# Patient Record
Sex: Male | Born: 1990 | Race: Black or African American | Hispanic: No | Marital: Single | Smoking: Current every day smoker
Health system: Southern US, Community
[De-identification: ages and names within clinical notes are randomized; demographics above are authoritative.]

## PROBLEM LIST (undated history)

## (undated) HISTORY — PX: DG HIP RIGHT COMPLETE (ARMC HX): HXRAD1537

---

## 2018-12-30 ENCOUNTER — Emergency Department (HOSPITAL_BASED_OUTPATIENT_CLINIC_OR_DEPARTMENT_OTHER): Payer: Self-pay

## 2018-12-30 ENCOUNTER — Encounter (HOSPITAL_BASED_OUTPATIENT_CLINIC_OR_DEPARTMENT_OTHER): Payer: Self-pay

## 2018-12-30 ENCOUNTER — Other Ambulatory Visit: Payer: Self-pay

## 2018-12-30 ENCOUNTER — Emergency Department (HOSPITAL_BASED_OUTPATIENT_CLINIC_OR_DEPARTMENT_OTHER)
Admission: EM | Admit: 2018-12-30 | Discharge: 2018-12-30 | Disposition: A | Discharge status: Home / Self Care | Payer: Self-pay | Attending: Emergency Medicine | Admitting: Emergency Medicine

## 2018-12-30 DIAGNOSIS — M25551 Pain in right hip: Secondary | ICD-10-CM | POA: Insufficient documentation

## 2018-12-30 DIAGNOSIS — F17210 Nicotine dependence, cigarettes, uncomplicated: Secondary | ICD-10-CM | POA: Insufficient documentation
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
## 2018-12-30 NOTE — ED Notes (Signed)
Pt. Reports he went to work and needs a note due to he can;t do the job he normally does.
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
## 2019-10-04 ENCOUNTER — Emergency Department (HOSPITAL_BASED_OUTPATIENT_CLINIC_OR_DEPARTMENT_OTHER)
Admission: EM | Admit: 2019-10-04 | Discharge: 2019-10-04 | Disposition: A | Discharge status: Home / Self Care | Payer: Self-pay | Attending: Emergency Medicine | Admitting: Emergency Medicine

## 2019-10-04 ENCOUNTER — Emergency Department (HOSPITAL_BASED_OUTPATIENT_CLINIC_OR_DEPARTMENT_OTHER): Payer: Self-pay

## 2019-10-04 ENCOUNTER — Other Ambulatory Visit: Payer: Self-pay

## 2019-10-04 ENCOUNTER — Encounter (HOSPITAL_BASED_OUTPATIENT_CLINIC_OR_DEPARTMENT_OTHER): Payer: Self-pay | Admitting: Emergency Medicine

## 2019-10-04 DIAGNOSIS — M25551 Pain in right hip: Secondary | ICD-10-CM | POA: Insufficient documentation

## 2019-10-04 DIAGNOSIS — F17200 Nicotine dependence, unspecified, uncomplicated: Secondary | ICD-10-CM | POA: Insufficient documentation

## 2019-10-04 MED ORDER — LIDOCAINE 5 % EX PTCH: 1.0000 | MEDICATED_PATCH | CUTANEOUS | 0 refills | Status: AC

## 2019-10-04 MED ORDER — METHOCARBAMOL 500 MG PO TABS: 500.0000 mg | ORAL_TABLET | Freq: Two times a day (BID) | ORAL | 0 refills | Status: AC

## 2019-10-04 NOTE — ED Triage Notes (Signed)
Pt states that he has had surgery to his right hip in 2015 - since mid January he has had pain to his hip that is increasing. His job will not given him light duty or " rest" until he has it evaluated
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
## 2019-10-04 NOTE — Discharge Instructions (Addendum)
  Antiinflammatory medications: Take 600 mg of ibuprofen every 6 hours or 440 mg (over the counter dose) to 500 mg (prescription dose) of naproxen every 12 hours for the next 3 days. After this time, these medications may be used as needed for pain. Take these medications with food to avoid upset stomach. Choose only one of these medications, do not take them together. Acetaminophen (generic for Tylenol): Should you continue to have additional pain while taking the ibuprofen or naproxen, you may add in acetaminophen as needed. Your daily total maximum amount of acetaminophen from all sources should be limited to 4000mg/day for persons without liver problems, or 2000mg/day for those with liver problems. Methocarbamol: Methocarbamol (generic for Robaxin) is a muscle relaxer and can help relieve stiff muscles or muscle spasms.  Do not drive or perform other dangerous activities while taking this medication as it can cause drowsiness as well as changes in reaction time and judgement. Lidocaine patches: These are available via either prescription or over-the-counter. The over-the-counter option may be more economical one and are likely just as effective. There are multiple over-the-counter brands, such as Salonpas. Ice: May apply ice to the area over the next 24 hours for 15 minutes at a time to reduce pain, inflammation, and swelling, if present. Exercises: Be sure to perform the attached exercises starting with three times a week and working up to performing them daily. This is an essential part of preventing long term problems.  Follow up: Follow up with a primary care provider for any future management of these complaints. Be sure to follow up within 7-10 days. Return: Return to the ED should symptoms worsen.  For prescription assistance, may try using prescription discount sites or apps, such as goodrx.com 

## 2021-09-24 IMAGING — CR DG HIP (WITH OR WITHOUT PELVIS) 2-3V*R*
3 series · 3 of 3 positions shown · non-contrast
Comparison: 12/30/2018

CLINICAL DATA: Hip pain. Previous acetabular repair 6654.

EXAM:
DG HIP (WITH OR WITHOUT PELVIS) 2-3V RIGHT

[t pelvis a.p.]
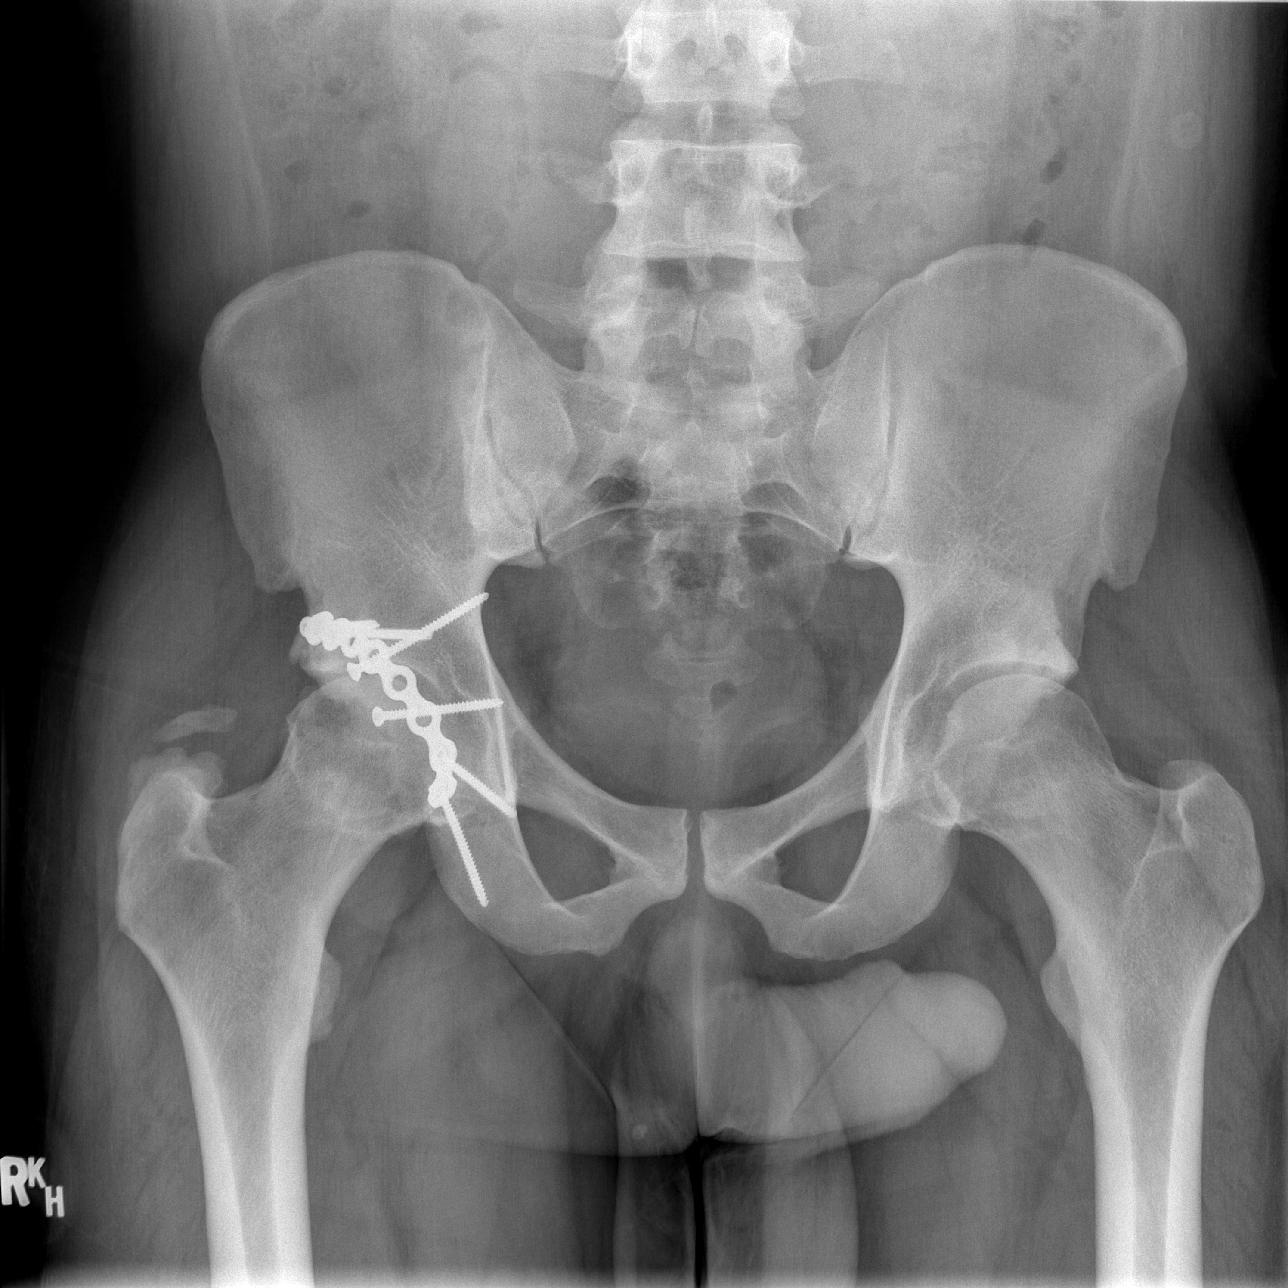

[t hip ap right]
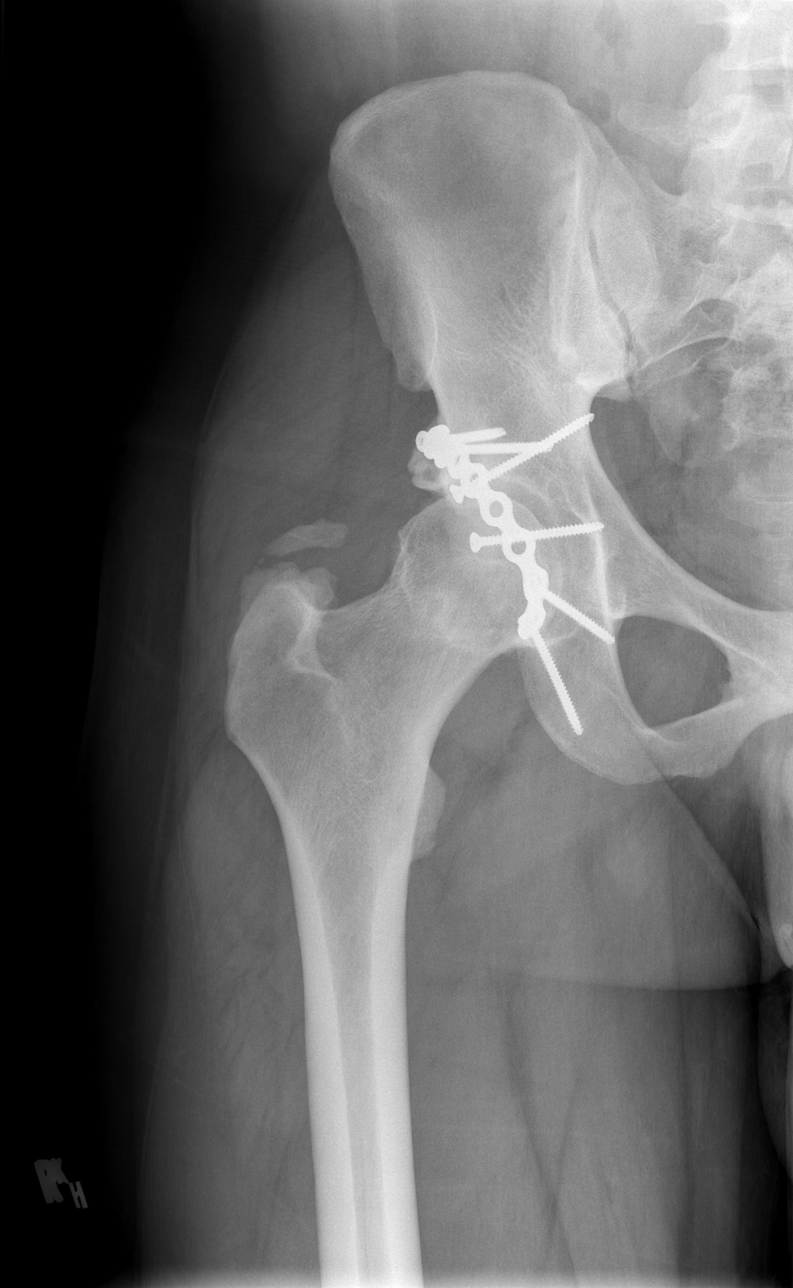

[t hip frog leg right]
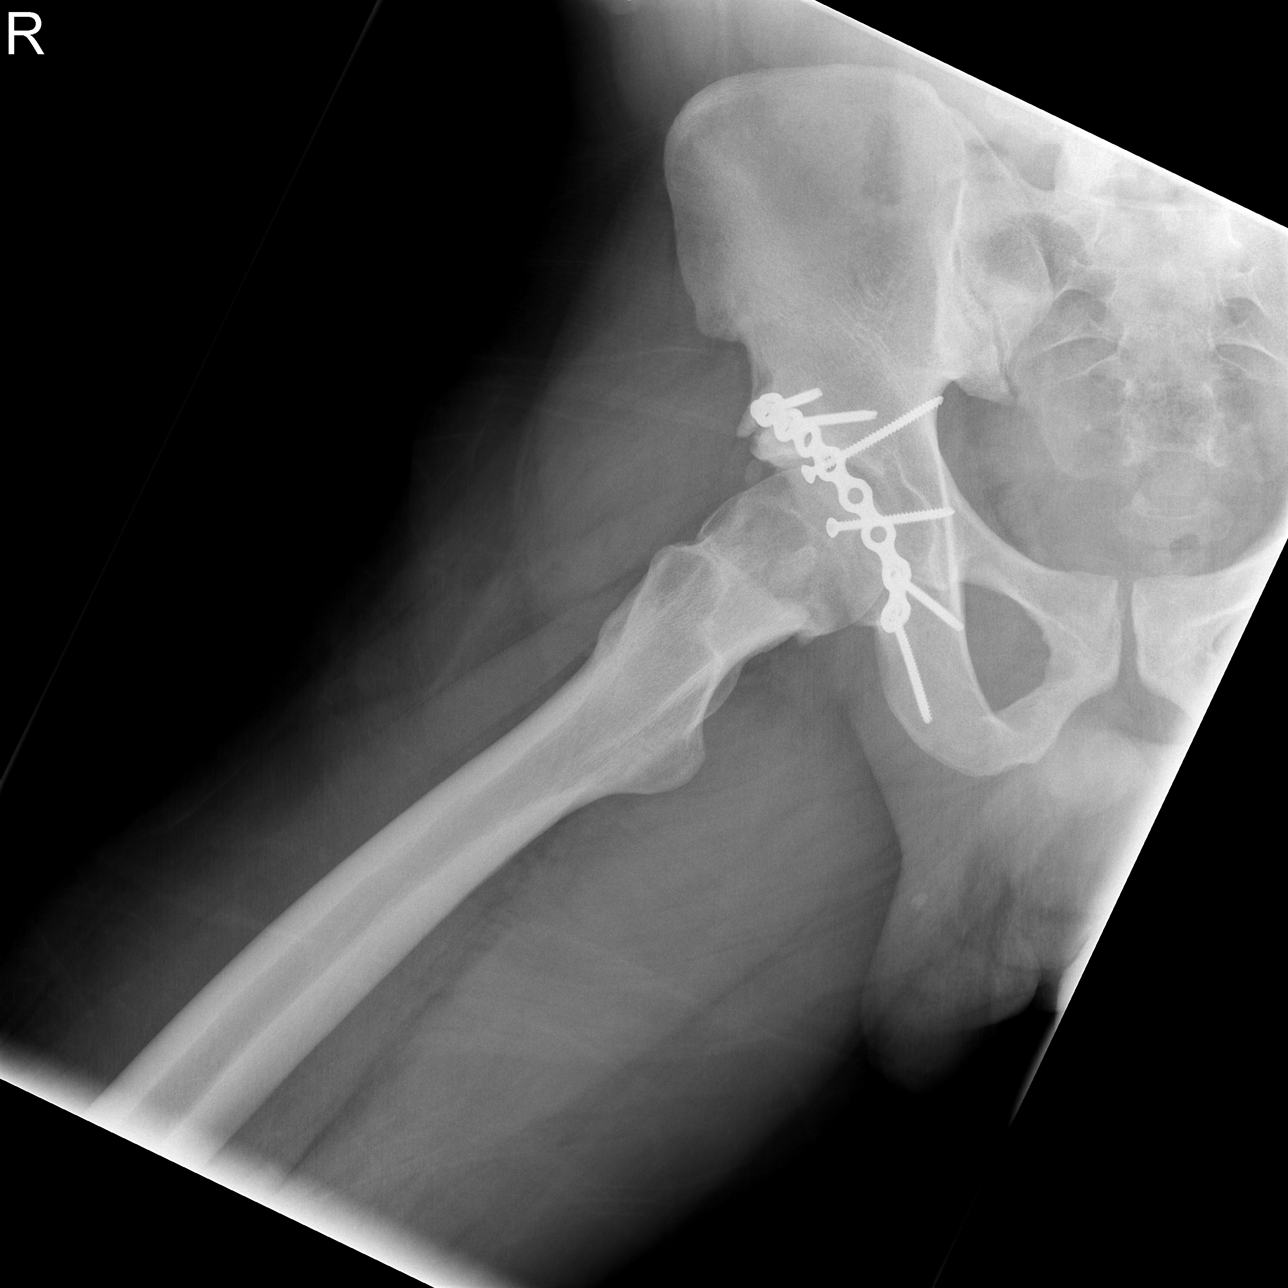

[3 of 3 positions shown; findings below may reference images not displayed]

FINDINGS: No fracture or dislocation. Stable changes of orthopedic hardware
about the right acetabulum. Bony pelvis intact. Early spurring from
the right humeral head. Heterotopic ossifications superior to the
greater trochanter as before.
IMPRESSION: Stable postop and degenerative changes as above. No acute findings.
